# Patient Record
Sex: Male | Born: 1995 | Race: White | Hispanic: No | Marital: Single | Smoking: Current every day smoker
Health system: Southern US, Community
[De-identification: ages and names within clinical notes are randomized; demographics above are authoritative.]

## PROBLEM LIST (undated history)

## (undated) DIAGNOSIS — IMO0001 Reserved for inherently not codable concepts without codable children: Secondary | ICD-10-CM

## (undated) DIAGNOSIS — K219 Gastro-esophageal reflux disease without esophagitis: Secondary | ICD-10-CM

## (undated) DIAGNOSIS — G43909 Migraine, unspecified, not intractable, without status migrainosus: Secondary | ICD-10-CM

## (undated) DIAGNOSIS — J0391 Acute recurrent tonsillitis, unspecified: Secondary | ICD-10-CM

---
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
## 2016-04-18 ENCOUNTER — Emergency Department (HOSPITAL_COMMUNITY)
Admission: EM | Admit: 2016-04-18 | Discharge: 2016-04-18 | Disposition: A | Discharge status: Home / Self Care | Payer: Self-pay | Attending: Emergency Medicine | Admitting: Emergency Medicine

## 2016-04-18 ENCOUNTER — Encounter (HOSPITAL_COMMUNITY): Payer: Self-pay | Admitting: Emergency Medicine

## 2016-04-18 DIAGNOSIS — R5383 Other fatigue: Secondary | ICD-10-CM | POA: Insufficient documentation

## 2016-04-18 DIAGNOSIS — F17200 Nicotine dependence, unspecified, uncomplicated: Secondary | ICD-10-CM | POA: Insufficient documentation

## 2016-04-18 DIAGNOSIS — L550 Sunburn of first degree: Secondary | ICD-10-CM | POA: Insufficient documentation

## 2016-04-18 LAB — CBC WITH DIFFERENTIAL/PLATELET
Basophils Absolute: 0 10*3/uL (ref 0.0–0.1)
Basophils Relative: 1 %
Eosinophils Absolute: 0.2 10*3/uL (ref 0.0–0.7)
Eosinophils Relative: 4 %
HCT: 45.2 % (ref 39.0–52.0)
HEMOGLOBIN: 15.5 g/dL (ref 13.0–17.0)
LYMPHS PCT: 43 %
Lymphs Abs: 2.2 10*3/uL (ref 0.7–4.0)
MCH: 31.9 pg (ref 26.0–34.0)
MCHC: 34.3 g/dL (ref 30.0–36.0)
MCV: 93 fL (ref 78.0–100.0)
Monocytes Absolute: 0.3 10*3/uL (ref 0.1–1.0)
Monocytes Relative: 6 %
NEUTROS PCT: 46 %
Neutro Abs: 2.4 10*3/uL (ref 1.7–7.7)
Platelets: 268 10*3/uL (ref 150–400)
RBC: 4.86 MIL/uL (ref 4.22–5.81)
RDW: 12.1 % (ref 11.5–15.5)
WBC: 5.1 10*3/uL (ref 4.0–10.5)

## 2016-04-18 LAB — URINALYSIS, ROUTINE W REFLEX MICROSCOPIC
Bilirubin Urine: NEGATIVE
Glucose, UA: NEGATIVE mg/dL
HGB URINE DIPSTICK: NEGATIVE
Ketones, ur: NEGATIVE mg/dL
Leukocytes, UA: NEGATIVE
Nitrite: NEGATIVE
Protein, ur: NEGATIVE mg/dL
SPECIFIC GRAVITY, URINE: 1.026 (ref 1.005–1.030)
pH: 7 (ref 5.0–8.0)

## 2016-04-18 LAB — BASIC METABOLIC PANEL
Anion gap: 8 (ref 5–15)
BUN: 11 mg/dL (ref 6–20)
CHLORIDE: 102 mmol/L (ref 101–111)
CO2: 30 mmol/L (ref 22–32)
Calcium: 9.6 mg/dL (ref 8.9–10.3)
Creatinine, Ser: 0.89 mg/dL (ref 0.61–1.24)
GFR calc Af Amer: >60 mL/min (ref >60)
GFR calc non Af Amer: >60 mL/min (ref >60)
GLUCOSE: 80 mg/dL (ref 65–99)
Potassium: 4.6 mmol/L (ref 3.5–5.1)
SODIUM: 140 mmol/L (ref 135–145)

## 2016-04-18 LAB — URINE MICROSCOPIC-ADD ON: Squamous Epithelial / LPF: NONE SEEN

## 2016-04-18 NOTE — ED Triage Notes (Signed)
Pt here for sunburn to back and thinks he has "sun poisoning"

## 2016-04-18 NOTE — ED Provider Notes (Signed)
[facility]-EMERGENCY DEPT Provider Note   CSN: [id number] Arrival date & time: 04/18/16  1734  By signing my name below, I, [name] [name], attest that this documentation has been prepared under the direction and in the presence of [name] Mornavid Karlen Barbar NP.  Electronically Signed: Vista Minkobert [name], ED Scribe. 04/18/16. 7:29 PM.   History   Chief Complaint Chief Complaint  Patient presents with  . Sunburn    HPI HPI Comments: [name] [name] is a 20 y.o. male with no pertinent PMHx, who presents to the Emergency Department complaining of sunburn to his back that has been persistent for the past 3 days. He was outside in the sun six days ago and noticed the burn the next day. Pt reports persistent pain to his back and the skin has been peeling. He further complains of intermittent fatigue and weakness while he has been at work. He states that he has been hydrating adequately. He denies any further symptoms.  The history is provided by the patient. No language interpreter was used.    Past Medical History:  Diagnosis Date  . Acid reflux    OTC as needed  . Migraines   . Recurrent tonsillitis 06/2013   chronic; will finish antibiotic 07/24/2013; occasionally snores during sleep, father denies apnea  . Reflux    ocassionally, uses prilosec PRN    Patient Active Problem List   Diagnosis Date Noted  . Migraine with aura and without status migrainosus, not intractable 03/01/2013  . Migraine without aura and without status migrainosus, not intractable 03/01/2013    Past Surgical History:  Procedure Laterality Date  . TONSILLECTOMY AND ADENOIDECTOMY N/A 07/25/2013   Procedure: TONSILLECTOMY AND  ADENOIDECTOMY;  Surgeon: Flo ShanksKarol Wolicki, MD;  Location: [facility];  Service: ENT;  Laterality: N/A;       Home Medications    Prior to Admission medications   Medication Sig Start Date End Date Taking? Authorizing Provider  amitriptyline (ELAVIL) 25 MG tablet Take 1 tablet (25 mg total) by  mouth at bedtime. Patient not taking: Reported on 07/01/2015 09/09/13   [name], MD  ibuprofen (ADVIL,MOTRIN) 600 MG tablet Take 1 tablet (600 mg total) by mouth every 6 (six) hours as needed. Patient not taking: Reported on 09/11/2015 07/01/15   [name], PA-C  ondansetron (ZOFRAN ODT) 4 MG disintegrating tablet Take 1 tablet (4 mg total) by mouth every 8 (eight) hours as needed for nausea or vomiting. 09/11/15   [name], MD  oxyCODONE-acetaminophen (PERCOCET/ROXICET) 5-325 MG tablet Take 1 tablet by mouth every 6 (six) hours as needed for severe pain. 09/11/15   [name], MD    Family History Family History  Problem Relation Age of Onset  . Migraines Mother   . Bipolar disorder Mother   . Anxiety disorder Mother   . Depression Mother   . ADD / ADHD Father   . Anesthesia problems Father     post-op N/V  . Bipolar disorder Maternal Grandmother   . Anxiety disorder Maternal Grandmother   . Depression Maternal Grandmother   . Migraines Maternal Grandfather   . Heart disease Paternal Grandfather   . Diabetes Paternal Grandfather   . ADD / ADHD Brother     Social History Social History  Substance Use Topics  . Smoking status: Current Every Day Smoker    Packs/day: 0.25  . Smokeless tobacco: Never Used     Comment: parents smoke outside  . Alcohol use Yes     Allergies  Review of patient's allergies indicates no known allergies.   Review of Systems Review of Systems  All other systems reviewed and are negative.    Physical Exam Updated Vital Signs BP 108/72 (BP Location: Right Arm)   Pulse 76   Temp 98.7 F (37.1 C) (Oral)   Resp 18   SpO2 99%   Physical Exam  Constitutional: He is oriented to person, place, and time. He appears well-developed and well-nourished.  HENT:  Head: Normocephalic.  Eyes: Conjunctivae are normal.  Cardiovascular: Normal rate and regular rhythm.   Pulmonary/Chest: Effort normal and breath sounds  normal.  Abdominal: Soft. Bowel sounds are normal.  Musculoskeletal: He exhibits no edema.  Lymphadenopathy:    He has no cervical adenopathy.  Neurological: He is alert and oriented to person, place, and time.  Skin: Skin is warm and dry.  Psychiatric: He has a normal mood and affect.  Nursing note and vitals reviewed.      ED Treatments / Results  DIAGNOSTIC STUDIES: Oxygen Saturation is 99% on RA, normal by my interpretation.  COORDINATION OF CARE: 7:30 PM-Will order blood work. Discussed treatment plan with pt at bedside and pt agreed to plan.   Labs (all labs ordered are listed, but only abnormal results are displayed) Labs Reviewed  URINALYSIS, ROUTINE W REFLEX MICROSCOPIC (NOT AT [facility]) - Abnormal; Notable for the following:       Result Value   APPearance TURBID (*)    All other components within normal limits  URINE MICROSCOPIC-ADD ON - Abnormal; Notable for the following:    Bacteria, UA RARE (*)    All other components within normal limits  CBC WITH DIFFERENTIAL/PLATELET  BASIC METABOLIC PANEL    EKG  EKG Interpretation None       Radiology No results found.  Procedures Procedures (including critical care time)  Medications Ordered in ED Medications - No data to display   Initial Impression / Assessment and Plan / ED Course  I have reviewed the triage vital signs and the nursing notes.  Pertinent labs & imaging results that were available during my care of the patient were reviewed by me and considered in my medical decision making (see chart for details).  Clinical Course  First degree sunburn on back without signs of infection.  Appears to be healing well. Labs obtained in evaluation of fatigue complaint reviewed and shared with patient, are reassuring. Care instructions provided and return precautions discussed.   Final Clinical Impressions(s) / ED Diagnoses   Final diagnoses:  1St degree sunburn  Other fatigue    New  Prescriptions There are no discharge medications for this patient.  I personally performed the services described in this documentation, which was scribed in my presence. The recorded information has been reviewed and is accurate.    [name] Morn, NP 04/19/16 0201    Blane Ohara, MD 04/21/16 [phone]

## 2016-06-23 ENCOUNTER — Telehealth (INDEPENDENT_AMBULATORY_CARE_PROVIDER_SITE_OTHER): Payer: Self-pay

## 2016-06-23 NOTE — Telephone Encounter (Signed)
Hashim lvm requesting a letter stating that provider prescribed amitriptyline in the past to treat his migraines. He needs the letter in order to get into the [employer].  He was last seen in our office on 09-09-13.

## 2016-06-24 NOTE — Telephone Encounter (Signed)
Called patient and discussed. The letter was written and placed at front desk for him to pick up.

## 2016-06-24 NOTE — Telephone Encounter (Signed)
Patient, [name] [name], called for a 2nd time, requesting a letter stating that he was prescribed and has taken Amitriptyline. He states that he is wanting to join the [employer]. He is requesting a call back.   [phone]

## 2017-03-08 IMAGING — CT CT RENAL STONE PROTOCOL
2 of 4 series · 5 of 46 positions shown, 7 images · non-contrast
Comparison: None.

CLINICAL DATA: Right-sided abdominal pain and nausea.

EXAM:
CT ABDOMEN AND PELVIS WITHOUT CONTRAST
TECHNIQUE: Multidetector CT imaging of the abdomen and pelvis was performed
following the standard protocol without IV contrast.

[Series 204: cor · coronal · 0.45mm/px · 4 of 114 slices shown, 5 images]
[im 26/114  soft-tissue]
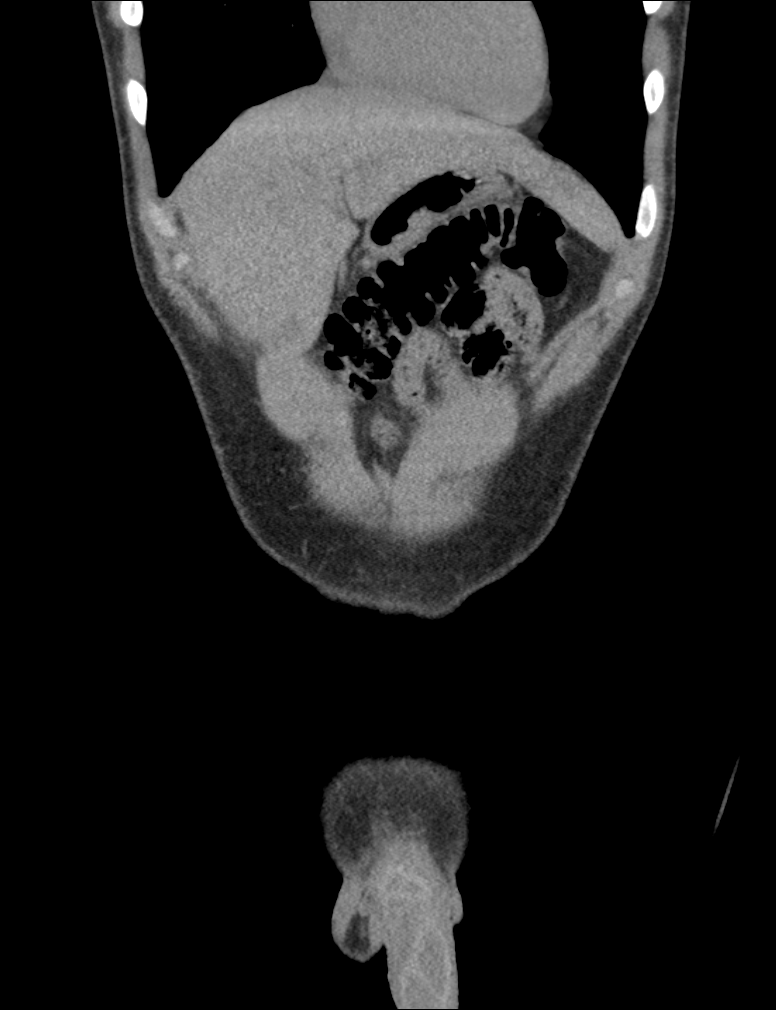
[im 26/114  bone]
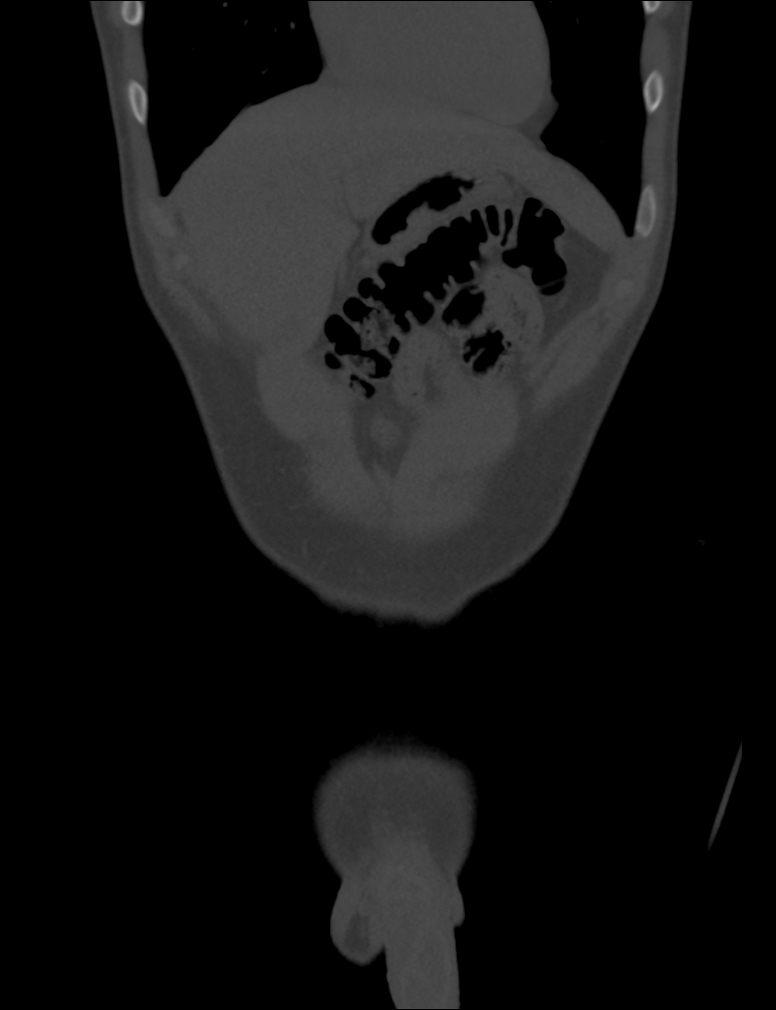
[im 51/114  soft-tissue]
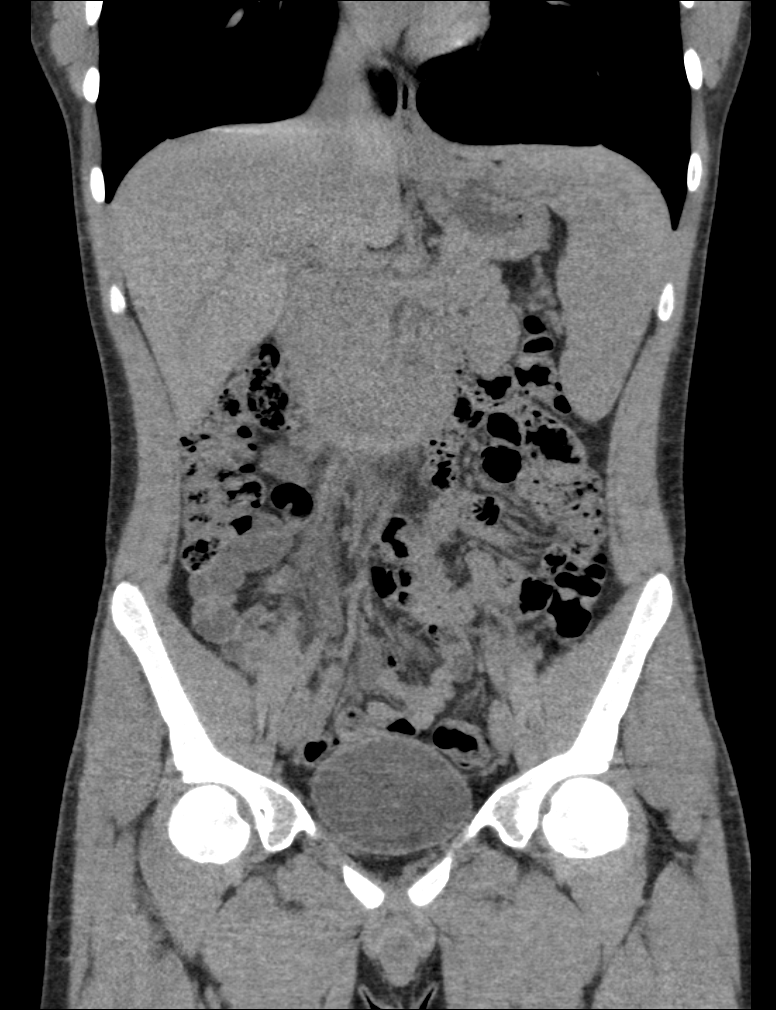
[im 76/114  soft-tissue]
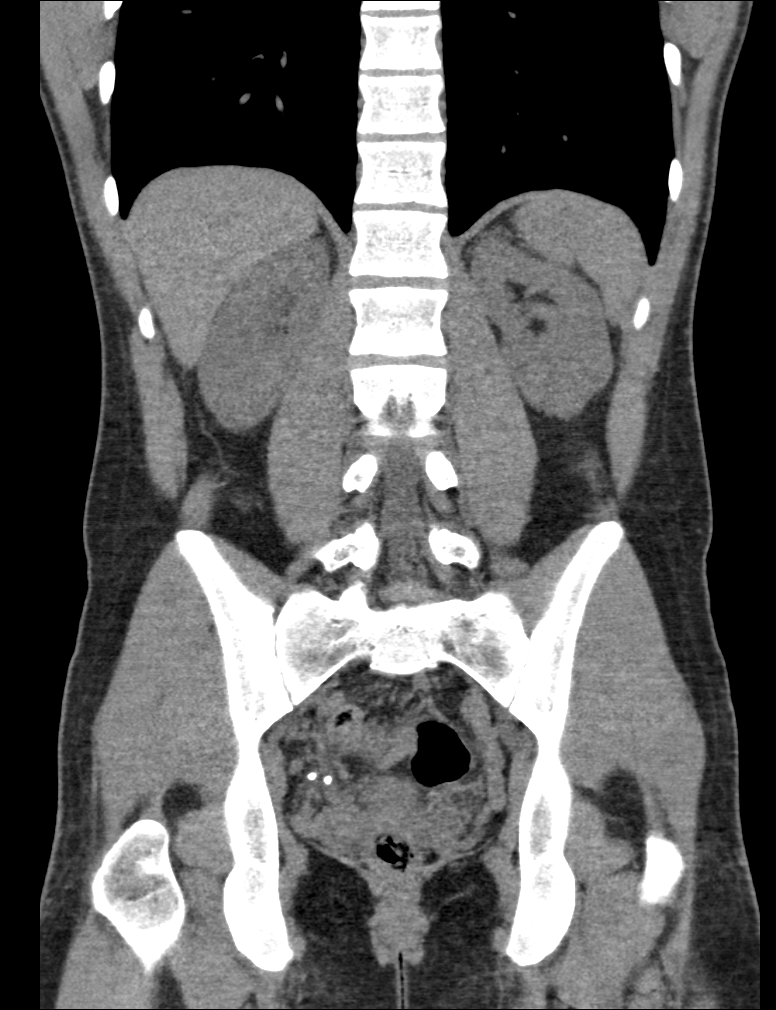
[im 101/114  soft-tissue]
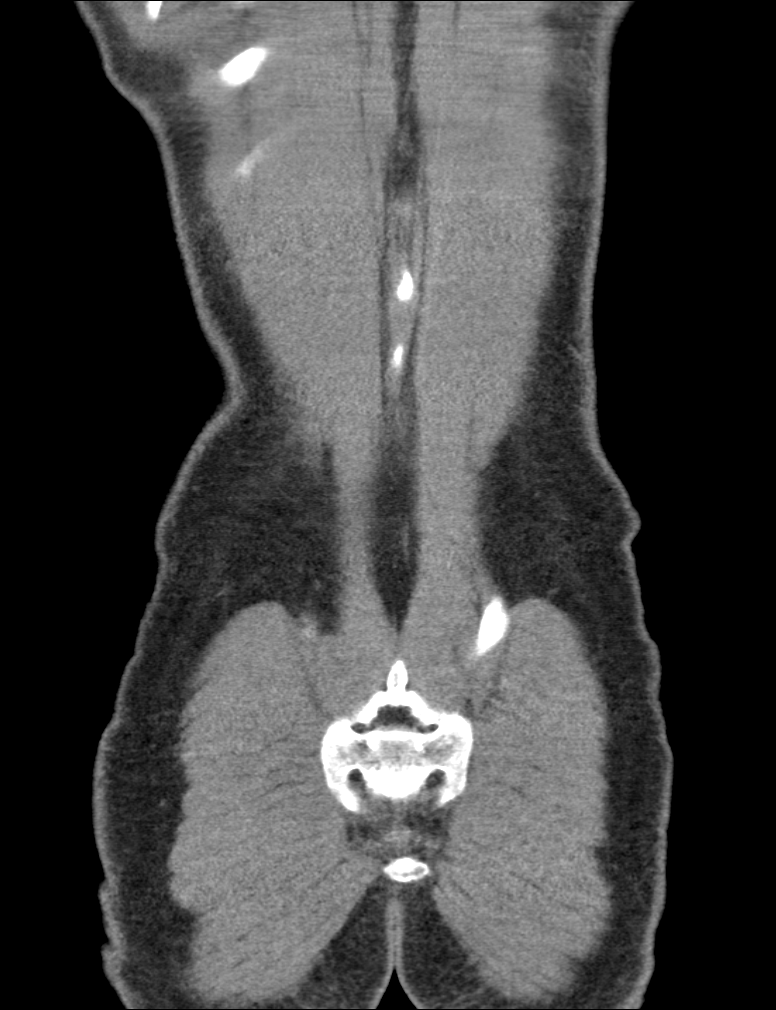

[Series 205: sag · sagittal · 0.45mm/px · 1 of 146 slices shown, 2 images]
[im 49/146  soft-tissue]
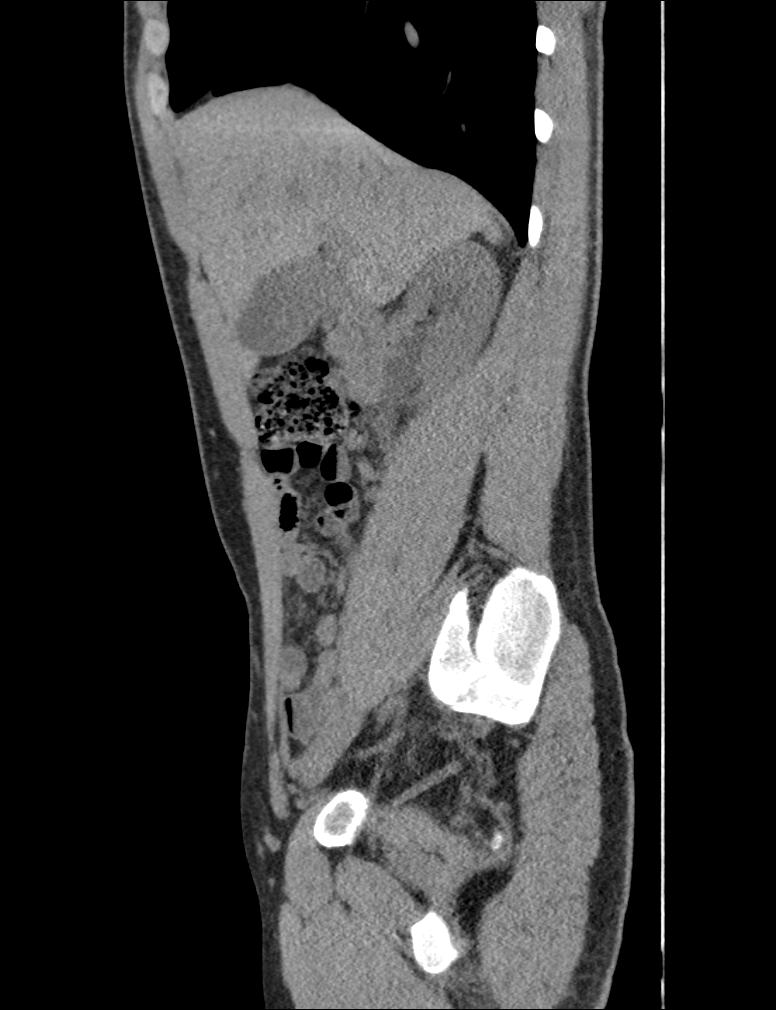
[im 49/146  bone]
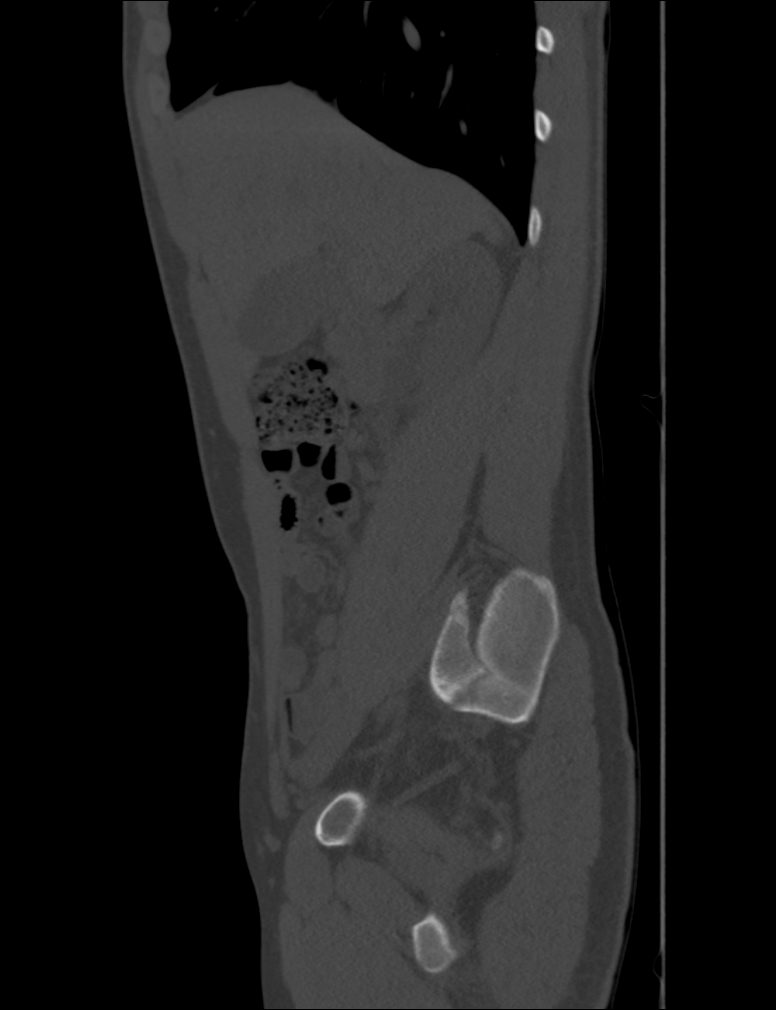

[5 of 46 positions shown; findings below may reference images not displayed]

FINDINGS: The lung bases are clear.

There is right-sided hydronephrosis and hydroureter with a 4 mm
stone in the distal right ureter just above the ureterovesical
junction. No bladder stone or bladder wall thickening appreciated.
Left kidney and ureter are decompressed.

The unenhanced appearance of the liver, spleen, gallbladder,
pancreas, adrenal glands, abdominal aorta, inferior vena cava, and
retroperitoneal lymph nodes is unremarkable.

Stomach, small bowel, and colon are decompressed. No free air or
free fluid in the abdomen. Abdominal wall musculature appears
intact.

Pelvis: The appendix is normal. Bladder wall is not thickened.
Prostate gland is not enlarged. No free or loculated pelvic fluid
collections. No pelvic mass or lymphadenopathy. No destructive bone
lesions.
IMPRESSION: 4 mm stone in the distal right ureter with moderate proximal
obstruction.
